# Patient Record
Sex: Female | Born: 1965
Health system: Southern US, Community
[De-identification: ages and names within clinical notes are randomized; demographics above are authoritative.]

## PROBLEM LIST (undated history)

## (undated) DIAGNOSIS — D219 Benign neoplasm of connective and other soft tissue, unspecified: Secondary | ICD-10-CM

## (undated) DIAGNOSIS — I10 Essential (primary) hypertension: Secondary | ICD-10-CM

## (undated) HISTORY — DX: Benign neoplasm of connective and other soft tissue, unspecified: D21.9

## (undated) HISTORY — DX: Essential (primary) hypertension: I10

## (undated) HISTORY — PX: OTHER SURGICAL HISTORY: SHX169

---

## 2020-02-10 DIAGNOSIS — M7581 Other shoulder lesions, right shoulder: Secondary | ICD-10-CM | POA: Diagnosis not present

## 2020-02-10 DIAGNOSIS — M19011 Primary osteoarthritis, right shoulder: Secondary | ICD-10-CM | POA: Diagnosis not present

## 2020-02-10 DIAGNOSIS — M25511 Pain in right shoulder: Secondary | ICD-10-CM | POA: Diagnosis not present

## 2020-02-14 MED FILL — DICLOFENAC SODIUM 1 % GEL: 1 | 30 days supply | Qty: 200 | Fill #0

## 2020-03-01 DIAGNOSIS — N920 Excessive and frequent menstruation with regular cycle: Secondary | ICD-10-CM | POA: Diagnosis not present

## 2020-03-01 DIAGNOSIS — Z124 Encounter for screening for malignant neoplasm of cervix: Secondary | ICD-10-CM | POA: Diagnosis not present

## 2020-03-01 DIAGNOSIS — R7989 Other specified abnormal findings of blood chemistry: Secondary | ICD-10-CM | POA: Diagnosis not present

## 2020-03-06 DIAGNOSIS — D259 Leiomyoma of uterus, unspecified: Secondary | ICD-10-CM | POA: Diagnosis not present

## 2020-03-06 DIAGNOSIS — N83202 Unspecified ovarian cyst, left side: Secondary | ICD-10-CM | POA: Diagnosis not present

## 2020-03-06 DIAGNOSIS — N852 Hypertrophy of uterus: Secondary | ICD-10-CM | POA: Diagnosis not present

## 2020-03-28 MED FILL — AMLODIPINE BESYLATE 5 MG TA: 5 | 90 days supply | Qty: 90 | Fill #0

## 2020-03-28 MED FILL — VALSARTAN 40 MG TABS: 40 | 90 days supply | Qty: 90 | Fill #0

## 2020-05-04 ENCOUNTER — Ambulatory Visit: Payer: Self-pay

## 2020-05-04 ENCOUNTER — Encounter: Payer: Self-pay | Admitting: Family Medicine

## 2020-05-04 ENCOUNTER — Ambulatory Visit (INDEPENDENT_AMBULATORY_CARE_PROVIDER_SITE_OTHER): Payer: 59 | Admitting: Family Medicine

## 2020-05-04 ENCOUNTER — Other Ambulatory Visit: Payer: Self-pay

## 2020-05-04 VITALS — BP 170/103 | HR 88 | Ht 66.0 in | Wt 297.0 lb

## 2020-05-04 DIAGNOSIS — M1711 Unilateral primary osteoarthritis, right knee: Secondary | ICD-10-CM | POA: Diagnosis not present

## 2020-05-04 DIAGNOSIS — G8929 Other chronic pain: Secondary | ICD-10-CM

## 2020-05-04 DIAGNOSIS — M1710 Unilateral primary osteoarthritis, unspecified knee: Secondary | ICD-10-CM | POA: Insufficient documentation

## 2020-05-04 DIAGNOSIS — M179 Osteoarthritis of knee, unspecified: Secondary | ICD-10-CM | POA: Insufficient documentation

## 2020-05-04 MED ORDER — PENNSAID 2 % EX SOLN: 1.0000 "application " | Freq: Two times a day (BID) | CUTANEOUS | 3 refills | Status: AC

## 2020-05-04 MED ORDER — IBUPROFEN-FAMOTIDINE 800-26.6 MG PO TABS: 1.0000 | ORAL_TABLET | Freq: Three times a day (TID) | ORAL | 3 refills | Status: AC

## 2020-05-04 NOTE — Assessment & Plan Note (Signed)
Seems to have a component of patellofemoral syndrome as well as degenerative changes.  Tracking seems to be more of an issue currently.  Does have moderate changes in the medial aspect -Counseled on home exercise therapy and supportive care. -Duexis sample.  Prescription sent in. -Pennsaid sent in. -Reaction brace. -Could consider imaging, physical therapy or injections improvement.

## 2020-05-04 NOTE — Progress Notes (Signed)
Linda Horton - 54 y.o. female MRN [id number]  Date of birth: 09-29-1966  SUBJECTIVE:  Including CC & ROS.  Chief Complaint  Patient presents with  . Knee Pain    bilateral knee    Linda Horton is a 54 y.o. female that is presenting with right knee pain.  The pain is acute on chronic in nature.  The pain is occurring anteriorly as well as medially.  It is worse with walking and standing.  Has pain intermittently.  Has tried ibuprofen which seems to help.  No history of surgery.  Seems localized to the knee.  No mechanical symptoms.   Review of Systems See HPI   HISTORY: Past Medical, Surgical, Social, and Family History Reviewed & Updated per EMR.   Pertinent Historical Findings include:  History reviewed. No pertinent past medical history.  History reviewed. No pertinent surgical history.  History reviewed. No pertinent family history.  Social History   Socioeconomic History  . Marital status: Unknown    Spouse name: Not on file  . Number of children: Not on file  . Years of education: Not on file  . Highest education level: Not on file  Occupational History  . Not on file  Tobacco Use  . Smoking status: Never Smoker  . Smokeless tobacco: Never Used  Substance and Sexual Activity  . Alcohol use: Not on file  . Drug use: Not on file  . Sexual activity: Not on file  Other Topics Concern  . Not on file  Social History Narrative  . Not on file   Social Determinants of Health   Financial Resource Strain:   . Difficulty of Paying Living Expenses:   Food Insecurity:   . Worried About Charity fundraiser in the Last Year:   . Arboriculturist in the Last Year:   Transportation Needs:   . Film/video editor (Medical):   Marland Kitchen Lack of Transportation (Non-Medical):   Physical Activity:   . Days of Exercise per Week:   . Minutes of Exercise per Session:   Stress:   . Feeling of Stress :   Social Connections:   . Frequency of Communication with Friends and Family:    . Frequency of Social Gatherings with Friends and Family:   . Attends Religious Services:   . Active Member of Clubs or Organizations:   . Attends Archivist Meetings:   Marland Kitchen Marital Status:   Intimate Partner Violence:   . Fear of Current or Ex-Partner:   . Emotionally Abused:   Marland Kitchen Physically Abused:   . Sexually Abused:      PHYSICAL EXAM:  VS: BP (!) 170/103   Pulse 88   Ht 5\' 6"  (1.676 m)   Wt 297 lb (134.7 kg)   BMI 47.94 kg/m  Physical Exam Gen: NAD, alert, cooperative with exam, well-appearing MSK:  Right knee:  No effusion  Normal strength resistance. No tenderness to palpation over the medial or lateral joint line. Normal range of motion. No instability. Negative McMurray's test. No pain with patellar grind. Neurovascularly intact  Limited ultrasound: Right knee:  No effusion suprapatellar pouch. Normal-appearing quadricep and patellar tendon. Moderate medial joint space narrowing with outpouching of the meniscus. Moderate lateral joint space narrowing  Summary: Degenerative changes of the medial compartment and meniscus.  Ultrasound and interpretation by Clearance Coots, MD    ASSESSMENT & PLAN:   Primary osteoarthritis of right knee Seems to have a component of patellofemoral syndrome as well as  degenerative changes.  Tracking seems to be more of an issue currently.  Does have moderate changes in the medial aspect -Counseled on home exercise therapy and supportive care. -Duexis sample.  Prescription sent in. -Pennsaid sent in. -Reaction brace. -Could consider imaging, physical therapy or injections improvement.

## 2020-05-04 NOTE — Patient Instructions (Signed)
Nice to meet you Please try ice  Please try the exercises  Please take the duexis for three days straight and then as needed  Please don't use the pennsaid and the duexis at the same time.  Please try a recumbent bike or getting in the water for exercise  Please send me a message in [place] with any questions or updates.  Please see me back in 4 weeks.   --Dr. Raeford Razor

## 2020-05-04 NOTE — Progress Notes (Signed)
Medication Samples have been provided to the patient.  Drug name: Duexis       Strength: 800mg /26.6mg         Qty: 1 Box  [id number]  Exp.Date: 01/2021  Dosing instructions: Take 1 tablet by mouth three (3) times a day.  The patient has been instructed regarding the correct time, dose, and frequency of taking this medication, including desired effects and most common side effects.   Sherrie George, MA 9:12 AM 05/04/2020

## 2020-06-01 ENCOUNTER — Ambulatory Visit: Payer: 59 | Admitting: Family Medicine

## 2020-06-08 ENCOUNTER — Ambulatory Visit: Payer: 59 | Admitting: Family Medicine

## 2020-06-28 MED FILL — AMLODIPINE BESYLATE 5 MG TA: 5 | 90 days supply | Qty: 90 | Fill #1

## 2020-06-28 MED FILL — VALSARTAN 40 MG TABLET: 40 | 90 days supply | Qty: 90 | Fill #1

## 2020-07-19 ENCOUNTER — Ambulatory Visit: Payer: 59 | Admitting: Family Medicine

## 2020-08-02 DIAGNOSIS — Z23 Encounter for immunization: Secondary | ICD-10-CM | POA: Diagnosis not present

## 2020-09-09 ENCOUNTER — Telehealth: Payer: 59 | Admitting: Nurse Practitioner

## 2020-09-09 DIAGNOSIS — M545 Low back pain, unspecified: Secondary | ICD-10-CM

## 2020-09-09 DIAGNOSIS — R399 Unspecified symptoms and signs involving the genitourinary system: Secondary | ICD-10-CM

## 2020-09-09 NOTE — Progress Notes (Signed)
Based on what you shared with me it looks like you have urinary symptoms with back pain,that should be evaluated in a face to face office visit. Due to associated back pain, you need to have a urinalysis and urine culture for proper treatment.    NOTE: If you entered your credit card information for this eVisit, you will not be charged. You may see a "hold" on your card for the $35 but that hold will drop off and you will not have a charge processed.  If you are having a true medical emergency please call 911.     For an urgent face to face visit, [place] has four urgent care centers for your convenience:   . [facility]    [phone]                  Get Driving Directions  [address], [place] [address] . 10 am to 8 pm Monday-Friday . 12 pm to 8 pm Saturday-Sunday   . [facility] at [place]                  Get Driving Directions  [address], [place], [place] [address] . 8 am to 8 pm Monday-Friday . 9 am to 6 pm Saturday . 11 am to 6 pm Sunday   . [facility] at [place]                  Get Driving Directions   [address], [place] [address] . 8 am to 8 pm Monday-Friday . 8 am to 4 pm Saturday-Sunday    . [facility] at [place]                    Get Driving Directions  [phone]  [address] [place], [place] [address]  . Monday-Friday, 12 PM to 6 PM    Your e-visit answers were reviewed by a board certified advanced clinical practitioner to complete your personal care plan.  Thank you for using e-Visits.

## 2020-09-15 ENCOUNTER — Encounter: Payer: Self-pay | Admitting: Obstetrics & Gynecology

## 2020-09-15 ENCOUNTER — Other Ambulatory Visit: Payer: Self-pay

## 2020-09-15 ENCOUNTER — Other Ambulatory Visit (HOSPITAL_BASED_OUTPATIENT_CLINIC_OR_DEPARTMENT_OTHER): Payer: Self-pay | Admitting: Internal Medicine

## 2020-09-15 ENCOUNTER — Ambulatory Visit (INDEPENDENT_AMBULATORY_CARE_PROVIDER_SITE_OTHER): Payer: 59 | Admitting: Obstetrics & Gynecology

## 2020-09-15 ENCOUNTER — Other Ambulatory Visit (HOSPITAL_COMMUNITY)
Admission: RE | Admit: 2020-09-15 | Discharge: 2020-09-15 | Disposition: A | Discharge status: Home / Self Care | Payer: 59 | Source: Ambulatory Visit | Attending: Obstetrics & Gynecology | Admitting: Obstetrics & Gynecology

## 2020-09-15 VITALS — BP 131/93 | HR 96 | Ht 66.0 in | Wt 297.0 lb

## 2020-09-15 DIAGNOSIS — D219 Benign neoplasm of connective and other soft tissue, unspecified: Secondary | ICD-10-CM | POA: Diagnosis not present

## 2020-09-15 DIAGNOSIS — N939 Abnormal uterine and vaginal bleeding, unspecified: Secondary | ICD-10-CM | POA: Diagnosis not present

## 2020-09-15 DIAGNOSIS — D509 Iron deficiency anemia, unspecified: Secondary | ICD-10-CM | POA: Diagnosis not present

## 2020-09-15 DIAGNOSIS — N840 Polyp of corpus uteri: Secondary | ICD-10-CM | POA: Diagnosis not present

## 2020-09-15 MED ORDER — MEGESTROL ACETATE 40 MG PO TABS: 40.0000 mg | ORAL_TABLET | Freq: Two times a day (BID) | ORAL | 5 refills | Status: DC

## 2020-09-15 MED ORDER — MEGESTROL ACETATE 40 MG PO TABS: 40.0000 mg | ORAL_TABLET | Freq: Two times a day (BID) | ORAL | 2 refills | Status: DC

## 2020-09-15 MED FILL — FLUARIX QUADRIVALENT 0.5 ML: 0.5 | 1 days supply | Qty: 1 | Fill #0

## 2020-09-15 MED FILL — MEGESTROL 40 MG TABLET: 40 | 15 days supply | Qty: 60 | Fill #0

## 2020-09-15 NOTE — Progress Notes (Signed)
Patient wants evaluation of fibroids/ heavy bleeding. Last ultrasound in April with [facility] Kathrene Alu RN

## 2020-09-15 NOTE — Progress Notes (Signed)
Subjective:     Linda Horton is a 54 y.o. female here for a routine exam. G4P4 Current complaints: very heavy abnormal uterine bleeding with clots. Pt bleeds 14 days of the months. Her sx are worse but, this is not a new problem. She has a D&C in 2015 for AUB. She denies being on meds for AUB in the past. She has also had 2 c/s but, no other GYN procedures. She reports that the heavy bleeding began in 2009.  Pt has not had a recent biopsy outside of the D&C in 2015.      Gynecologic History No LMP recorded. Contraception: none Last Pap: 03/01/2020. Results were: normal Last mammogram: 2021 at [facility]. WNL  Obstetric History OB History  Gravida Para Term Preterm AB Living  4 4 4  0 0 4  SAB TAB Ectopic Multiple Live Births  0 0 0 0 4    # Outcome Date GA Lbr Len/2nd Weight Sex Delivery Anes PTL Lv  4 Term           3 Term           2 Term           1 Term            The following portions of the patient's history were reviewed and updated as appropriate: allergies, current medications, past family history, past medical history, past social history, past surgical history and problem list.  Review of Systems Pertinent items are noted in HPI.    Objective:  BP (!) 131/93   Pulse 96   Ht 5\' 6"  (1.676 m)   Wt 297 lb (134.7 kg)   BMI 47.94 kg/m   CONSTITUTIONAL: Well-developed, well-nourished female in no acute distress.  HENT:  Normocephalic, atraumatic EYES: Conjunctivae and EOM are normal. No scleral icterus.  NECK: Normal range of motion SKIN: Skin is warm and dry. No rash noted. Not diaphoretic.No pallor. [place]: Alert and oriented to person, place, and time. Normal coordination.  GU: EGBUS: no lesions Vagina: no blood in vault Cervix: no lesion; no mucopurulent d/c Uterus: small, mobile Adnexa: no masses; sl tender    The indications for endometrial biopsy were reviewed.   Risks of the biopsy including cramping, bleeding, infection, uterine perforation, inadequate  specimen and need for additional procedures  were discussed. The patient states she understands and agrees to undergo procedure today. Consent was signed. Time out was performed. Urine HCG was negative. A sterile speculum was placed in the patient's vagina and the cervix was prepped with Betadine. A single-toothed tenaculum was placed on the anterior lip of the cervix to stabilize it. The 3 mm pipelle was introduced into the endometrial cavity without difficulty to a depth of >13cm, and a moderate amount of tissue was obtained and sent to pathology. The instruments were removed from the patient's vagina. Minimal bleeding from the cervix was noted. The patient tolerated the procedure well. Routine post-procedure instructions were given to the patient. The patient will follow up to review the results and for further management.      Healthy female exam.    03/06/2020 IMPRESSION:  1. Enlarged heterogeneous uterus containing multiple fibroids. Heterogeneity might suggest adenomyosis.  2. Endometrial stripe not well seen but measures approximately 4 mm.  3. 3.1 cm cystic left ovarian lesion.  4. Right ovary not clearly identified.  5. No ascites.   Electronically Signed by: Ruta Hinds Narrative Performed by [id number] INDICATION: Menorrhagia   COMPARISON: 02/21/2016   TECHNIQUE:  Transabdominal and transvaginal pelvic ultrasound.   FINDINGS: Uterus is enlarged and heterogeneous in echotexture measuring 15.4 x 7.6 x 10.3 with multiple fibroids. The endometrial stripe is not well seen but measures approximately 4 mm. Largest fibroid is left fundal measuring 4.6 x 3.7 x 4.4 cm. Left ovary measures 3.9 x 4 x 4.5 cm and is remarkable for 3.1 cm cystic lesion. Right ovary is not clearly identified. No ascites. Procedure Note  Marjory Lies, MD - 03/06/2020  Formatting of this note might be different from the original.  INDICATION: Menorrhagia     Assessment:   AUB thought due to fibroids but, need  to r/o other endometrial process.    Plan:   Diagnoses and all orders for this visit:  Fibroids -     Discontinue: megestrol (MEGACE) 40 MG tablet; Take 1 tablet (40 mg total) by mouth 2 (two) times daily. Can increase to two tablets twice a day in the event of heavy bleeding -     Surgical pathology -     TSH -     CBC -     megestrol (MEGACE) 40 MG tablet; Take 1 tablet (40 mg total) by mouth 2 (two) times daily. Can increase to two tablets twice a day in the event of heavy bleeding  Abnormal uterine bleeding (AUB) -     Discontinue: megestrol (MEGACE) 40 MG tablet; Take 1 tablet (40 mg total) by mouth 2 (two) times daily. Can increase to two tablets twice a day in the event of heavy bleeding -     Surgical pathology -     megestrol (MEGACE) 40 MG tablet; Take 1 tablet (40 mg total) by mouth 2 (two) times daily. Can increase to two tablets twice a day in the event of heavy bleeding  Iron deficiency anemia, unspecified iron deficiency anemia type -     CBC  f/u for results of endo bx F/u in 3 months for AUB  Linda Horton, M.D., Cherlynn June

## 2020-09-16 LAB — CBC
Hematocrit: 38.6 % (ref 34.0–46.6)
Hemoglobin: 12 g/dL (ref 11.1–15.9)
MCH: 23.8 pg — ABNORMAL LOW (ref 26.6–33.0)
MCHC: 31.1 g/dL — ABNORMAL LOW (ref 31.5–35.7)
MCV: 76 fL — ABNORMAL LOW (ref 79–97)
Platelets: 407 10*3/uL (ref 150–450)
RBC: 5.05 x10E6/uL (ref 3.77–5.28)
RDW: 17.2 % — ABNORMAL HIGH (ref 11.7–15.4)
WBC: 9.9 10*3/uL (ref 3.4–10.8)

## 2020-09-16 LAB — TSH: TSH: 3.03 u[IU]/mL (ref 0.450–4.500)

## 2020-09-18 LAB — SURGICAL PATHOLOGY

## 2020-09-29 ENCOUNTER — Other Ambulatory Visit (HOSPITAL_BASED_OUTPATIENT_CLINIC_OR_DEPARTMENT_OTHER): Payer: Self-pay | Admitting: Family Medicine

## 2020-09-29 MED FILL — AMLODIPINE BESYLATE 5 MG TA: 5 | 90 days supply | Qty: 90 | Fill #0

## 2020-09-29 MED FILL — VALSARTAN 40 MG TABS: 40 | 90 days supply | Qty: 90 | Fill #0

## 2020-10-06 DIAGNOSIS — R5383 Other fatigue: Secondary | ICD-10-CM | POA: Diagnosis not present

## 2020-10-06 DIAGNOSIS — Z0000 Encounter for general adult medical examination without abnormal findings: Secondary | ICD-10-CM | POA: Diagnosis not present

## 2020-10-06 DIAGNOSIS — I10 Essential (primary) hypertension: Secondary | ICD-10-CM | POA: Diagnosis not present

## 2020-10-11 DIAGNOSIS — N3000 Acute cystitis without hematuria: Secondary | ICD-10-CM | POA: Diagnosis not present

## 2020-10-11 DIAGNOSIS — R399 Unspecified symptoms and signs involving the genitourinary system: Secondary | ICD-10-CM | POA: Diagnosis not present

## 2020-10-12 ENCOUNTER — Other Ambulatory Visit (HOSPITAL_BASED_OUTPATIENT_CLINIC_OR_DEPARTMENT_OTHER): Payer: Self-pay | Admitting: Internal Medicine

## 2020-10-12 DIAGNOSIS — K219 Gastro-esophageal reflux disease without esophagitis: Secondary | ICD-10-CM | POA: Diagnosis not present

## 2020-10-12 DIAGNOSIS — Z1231 Encounter for screening mammogram for malignant neoplasm of breast: Secondary | ICD-10-CM | POA: Diagnosis not present

## 2020-10-12 DIAGNOSIS — Z1211 Encounter for screening for malignant neoplasm of colon: Secondary | ICD-10-CM | POA: Diagnosis not present

## 2020-10-12 DIAGNOSIS — Z23 Encounter for immunization: Secondary | ICD-10-CM | POA: Diagnosis not present

## 2020-10-12 DIAGNOSIS — I10 Essential (primary) hypertension: Secondary | ICD-10-CM | POA: Diagnosis not present

## 2020-10-12 DIAGNOSIS — E1165 Type 2 diabetes mellitus with hyperglycemia: Secondary | ICD-10-CM | POA: Diagnosis not present

## 2020-10-12 DIAGNOSIS — Z0000 Encounter for general adult medical examination without abnormal findings: Secondary | ICD-10-CM | POA: Diagnosis not present

## 2020-10-12 DIAGNOSIS — E785 Hyperlipidemia, unspecified: Secondary | ICD-10-CM | POA: Diagnosis not present

## 2020-10-16 DIAGNOSIS — Z020 Encounter for examination for admission to educational institution: Secondary | ICD-10-CM | POA: Diagnosis not present

## 2020-10-30 ENCOUNTER — Ambulatory Visit: Payer: 59 | Admitting: Family Medicine

## 2020-11-03 ENCOUNTER — Ambulatory Visit: Payer: Self-pay

## 2020-11-03 ENCOUNTER — Ambulatory Visit: Payer: 59 | Admitting: Family Medicine

## 2020-11-03 ENCOUNTER — Other Ambulatory Visit: Payer: Self-pay

## 2020-11-03 DIAGNOSIS — M170 Bilateral primary osteoarthritis of knee: Secondary | ICD-10-CM | POA: Diagnosis not present

## 2020-11-03 MED ORDER — TRIAMCINOLONE ACETONIDE 40 MG/ML IJ SUSP
40.0000 mg | Freq: Once | INTRAMUSCULAR | Status: AC
  Administered 2020-11-03: 40 mg via INTRA_ARTICULAR

## 2020-11-03 NOTE — Patient Instructions (Addendum)
Good to see you Please try ice  I will call you with the results from today  Please send me a message in MyChart with any questions or updates.  Please see me back in 4 weeks.   --Dr. Raeford Razor

## 2020-11-03 NOTE — Assessment & Plan Note (Addendum)
Acute worsening of bilateral knee pain.  Does have degenerative changes. -Counseled on home exercise therapy and supportive care. -Bilateral injections. -X-ray. -Could consider gel injections or further imaging or physical therapy.

## 2020-11-03 NOTE — Progress Notes (Signed)
[name] - 54 y.o. female MRN [id number]  Date of birth: [birth year]  SUBJECTIVE:  Including CC & ROS.  No chief complaint on file.   Linda Horton is a 54 y.o. female that is presenting with acute worsening bilateral knee pain.  She is trying conservative measures and has had no improvement.  Pain is worse of the medial aspect of each knee.  It is worse with changes in weather..   Review of Systems See HPI   HISTORY: Past Medical, Surgical, Social, and Family History Reviewed & Updated per EMR.   Pertinent Historical Findings include:  Past Medical History:  Diagnosis Date  . Fibroids   . Hypertension     Past Surgical History:  Procedure Laterality Date  . CESAREAN SECTION    . galbladder      Family History  Problem Relation Age of Onset  . Diabetes Father   . Cancer Neg Hx     Social History   Socioeconomic History  . Marital status: Divorced    Spouse name: Not on file  . Number of children: 4  . Years of education: Not on file  . Highest education level: Bachelor's degree (e.g., BA, AB, BS)  Occupational History  . Not on file  Tobacco Use  . Smoking status: Never Smoker  . Smokeless tobacco: Never Used  Vaping Use  . Vaping Use: Never used  Substance and Sexual Activity  . Alcohol use: Never  . Drug use: Never  . Sexual activity: Not Currently  Other Topics Concern  . Not on file  Social History Narrative  . Not on file   Social Determinants of Health   Financial Resource Strain:   . Difficulty of Paying Living Expenses: Not on file  Food Insecurity:   . Worried About Charity fundraiser in the Last Year: Not on file  . Ran Out of Food in the Last Year: Not on file  Transportation Needs:   . Lack of Transportation (Medical): Not on file  . Lack of Transportation (Non-Medical): Not on file  Physical Activity:   . Days of Exercise per Week: Not on file  . Minutes of Exercise per Session: Not on file  Stress:   . Feeling of Stress : Not  on file  Social Connections:   . Frequency of Communication with Friends and Family: Not on file  . Frequency of Social Gatherings with Friends and Family: Not on file  . Attends Religious Services: Not on file  . Active Member of Clubs or Organizations: Not on file  . Attends Archivist Meetings: Not on file  . Marital Status: Not on file  Intimate Partner Violence:   . Fear of Current or Ex-Partner: Not on file  . Emotionally Abused: Not on file  . Physically Abused: Not on file  . Sexually Abused: Not on file     PHYSICAL EXAM:  VS: BP (!) 200/100   Ht 5\' 6"  (1.676 m)   Wt 300 lb (136.1 kg)   BMI 48.42 kg/m  Physical Exam Gen: NAD, alert, cooperative with exam, well-appearing MSK:  Right and left knee:  No effusion  Normal range of motion  Normal strength to resistance  Neurovascularly intact    Aspiration/Injection Procedure Note Linda Horton 05-06-1966  Procedure: Injection Indications: Left knee pain  Procedure Details Consent: Risks of procedure as well as the alternatives and risks of each were explained to the (patient/caregiver).  Consent for procedure obtained. Time Out: Verified patient  identification, verified procedure, site/side was marked, verified correct patient position, special equipment/implants available, medications/allergies/relevent history reviewed, required imaging and test results available.  Performed.  The area was cleaned with iodine and alcohol swabs.    The left knee superior lateral suprapatellar pouch was injected 3 cc of 1% lidocaine on a 21-gauge 2 inch needle.  The syringe was switched and a mixture containing 1 cc's of 40 mg Kenalog and 4 cc's of 0.25% bupivacaine was injected.  Ultrasound was used. Images were obtained in long views showing the injection.     A sterile dressing was applied.  Patient did tolerate procedure well.   Aspiration/Injection Procedure Note Linda Horton 12-30-1965  Procedure:  Injection Indications: Right knee pain  Procedure Details Consent: Risks of procedure as well as the alternatives and risks of each were explained to the (patient/caregiver).  Consent for procedure obtained. Time Out: Verified patient identification, verified procedure, site/side was marked, verified correct patient position, special equipment/implants available, medications/allergies/relevent history reviewed, required imaging and test results available.  Performed.  The area was cleaned with iodine and alcohol swabs.    The right knee superior lateral suprapatellar pouch was injected 3 cc of 1% lidocaine on a 21-gauge 2 inch needle.  The syringe was switched and a mixture containing 1 cc's of 40 mg Kenalog and 4 cc's of 0.25% bupivacaine was injected.  Ultrasound was used. Images were obtained in long views showing the injection.     A sterile dressing was applied.  Patient did tolerate procedure well.     ASSESSMENT & PLAN:   OA (osteoarthritis) of knee Acute worsening of bilateral knee pain.  Does have degenerative changes. -Counseled on home exercise therapy and supportive care. -Bilateral injections. -X-ray. -Could consider gel injections or further imaging or physical therapy.

## 2020-12-06 DIAGNOSIS — R059 Cough, unspecified: Secondary | ICD-10-CM | POA: Diagnosis not present

## 2020-12-06 DIAGNOSIS — Z20822 Contact with and (suspected) exposure to covid-19: Secondary | ICD-10-CM | POA: Diagnosis not present

## 2020-12-06 DIAGNOSIS — R5383 Other fatigue: Secondary | ICD-10-CM | POA: Diagnosis not present

## 2020-12-07 DIAGNOSIS — I10 Essential (primary) hypertension: Secondary | ICD-10-CM | POA: Diagnosis not present

## 2020-12-07 DIAGNOSIS — Z79899 Other long term (current) drug therapy: Secondary | ICD-10-CM | POA: Diagnosis not present

## 2020-12-07 DIAGNOSIS — R002 Palpitations: Secondary | ICD-10-CM | POA: Diagnosis not present

## 2020-12-07 DIAGNOSIS — K219 Gastro-esophageal reflux disease without esophagitis: Secondary | ICD-10-CM | POA: Diagnosis not present

## 2020-12-07 DIAGNOSIS — Z888 Allergy status to other drugs, medicaments and biological substances status: Secondary | ICD-10-CM | POA: Diagnosis not present

## 2020-12-07 DIAGNOSIS — R079 Chest pain, unspecified: Secondary | ICD-10-CM | POA: Diagnosis not present

## 2020-12-07 DIAGNOSIS — R059 Cough, unspecified: Secondary | ICD-10-CM | POA: Diagnosis not present

## 2020-12-28 ENCOUNTER — Other Ambulatory Visit (HOSPITAL_BASED_OUTPATIENT_CLINIC_OR_DEPARTMENT_OTHER): Payer: Self-pay | Admitting: Internal Medicine

## 2020-12-29 ENCOUNTER — Ambulatory Visit (HOSPITAL_BASED_OUTPATIENT_CLINIC_OR_DEPARTMENT_OTHER)
Admission: RE | Admit: 2020-12-29 | Discharge: 2020-12-29 | Disposition: A | Discharge status: Home / Self Care | Payer: 59 | Source: Ambulatory Visit | Attending: Family Medicine | Admitting: Family Medicine

## 2020-12-29 ENCOUNTER — Other Ambulatory Visit: Payer: Self-pay

## 2020-12-29 DIAGNOSIS — M1712 Unilateral primary osteoarthritis, left knee: Secondary | ICD-10-CM | POA: Diagnosis not present

## 2020-12-29 DIAGNOSIS — M1711 Unilateral primary osteoarthritis, right knee: Secondary | ICD-10-CM | POA: Diagnosis not present

## 2020-12-29 DIAGNOSIS — M170 Bilateral primary osteoarthritis of knee: Secondary | ICD-10-CM | POA: Insufficient documentation

## 2020-12-29 MED FILL — AMLODIPINE BESYLATE 5 MG TA: 5 | 90 days supply | Qty: 90 | Fill #1

## 2020-12-29 MED FILL — VALSARTAN 40 MG TABS: 40 | 90 days supply | Qty: 90 | Fill #1

## 2020-12-29 MED FILL — OMEPRAZOLE 20 MG CAP: 20 | 30 days supply | Qty: 30 | Fill #0

## 2021-01-02 ENCOUNTER — Telehealth: Payer: Self-pay | Admitting: Family Medicine

## 2021-01-02 DIAGNOSIS — M170 Bilateral primary osteoarthritis of knee: Secondary | ICD-10-CM

## 2021-01-02 NOTE — Telephone Encounter (Signed)
Informed of results. Referral to PT. Can try gel injection.   Rosemarie Ax, MD [facility] 01/02/2021, 8:22 AM

## 2021-01-17 MED FILL — OMEPRAZOLE 20 MG CAP: 20 | 30 days supply | Qty: 30 | Fill #0

## 2021-01-17 MED FILL — VIT D3-50 50,000 UNITS CAPS: 1.25 MG | 28 days supply | Qty: 4 | Fill #0

## 2021-01-29 ENCOUNTER — Telehealth: Payer: Self-pay | Admitting: Family Medicine

## 2021-01-29 NOTE — Telephone Encounter (Signed)
Called pt to advise Handicap placard is ready for pick up--- left message.--glh

## 2021-02-01 ENCOUNTER — Other Ambulatory Visit (HOSPITAL_BASED_OUTPATIENT_CLINIC_OR_DEPARTMENT_OTHER): Payer: Self-pay | Admitting: Internal Medicine

## 2021-02-01 MED FILL — VIT D2 1.25 MG (50,000 UNIT: 1.25 MG | 28 days supply | Qty: 4 | Fill #0

## 2021-02-02 ENCOUNTER — Other Ambulatory Visit: Payer: Self-pay

## 2021-02-02 ENCOUNTER — Ambulatory Visit: Payer: Self-pay

## 2021-02-02 ENCOUNTER — Ambulatory Visit (INDEPENDENT_AMBULATORY_CARE_PROVIDER_SITE_OTHER): Payer: 59 | Admitting: Family Medicine

## 2021-02-02 VITALS — BP 160/100 | Ht 66.0 in | Wt 295.0 lb

## 2021-02-02 DIAGNOSIS — M170 Bilateral primary osteoarthritis of knee: Secondary | ICD-10-CM

## 2021-02-02 MED ORDER — TRIAMCINOLONE ACETONIDE 40 MG/ML IJ SUSP
40.0000 mg | Freq: Once | INTRAMUSCULAR | Status: AC
  Administered 2021-02-02: 40 mg via INTRA_ARTICULAR

## 2021-02-02 NOTE — Progress Notes (Signed)
Linda Horton - 55 y.o. female MRN [id number]  Date of birth: 10/31/1966  SUBJECTIVE:  Including CC & ROS.  No chief complaint on file.   Linda Horton is a 55 y.o. female that is presenting with acute exacerbation of her leg pain.  The right knee is worse than the left knee.   Review of Systems See HPI   HISTORY: Past Medical, Surgical, Social, and Family History Reviewed & Updated per EMR.   Pertinent Historical Findings include:  Past Medical History:  Diagnosis Date  . Fibroids   . Hypertension     Past Surgical History:  Procedure Laterality Date  . CESAREAN SECTION    . galbladder      Family History  Problem Relation Age of Onset  . Diabetes Father   . Cancer Neg Hx     Social History   Socioeconomic History  . Marital status: Divorced    Spouse name: Not on file  . Number of children: 4  . Years of education: Not on file  . Highest education level: Bachelor's degree (e.g., BA, AB, BS)  Occupational History  . Not on file  Tobacco Use  . Smoking status: Never Smoker  . Smokeless tobacco: Never Used  Vaping Use  . Vaping Use: Never used  Substance and Sexual Activity  . Alcohol use: Never  . Drug use: Never  . Sexual activity: Not Currently  Other Topics Concern  . Not on file  Social History Narrative  . Not on file   Social Determinants of Health   Financial Resource Strain: Not on file  Food Insecurity: Not on file  Transportation Needs: Not on file  Physical Activity: Not on file  Stress: Not on file  Social Connections: Not on file  Intimate Partner Violence: Not on file     PHYSICAL EXAM:  VS: BP (!) 160/100   Ht 5\' 6"  (1.676 m)   Wt 295 lb (133.8 kg)   BMI 47.61 kg/m  Physical Exam Gen: NAD, alert, cooperative with exam, well-appearing MSK:  Right and left knee: Normal range of motion. No obvious effusion. Tenderness palpation of the medial joint space. Neurovascularly intact   Aspiration/Injection Procedure  Note Imberly Troxler 11-08-1966  Procedure: Injection Indications: Right knee pain  Procedure Details Consent: Risks of procedure as well as the alternatives and risks of each were explained to the (patient/caregiver).  Consent for procedure obtained. Time Out: Verified patient identification, verified procedure, site/side was marked, verified correct patient position, special equipment/implants available, medications/allergies/relevent history reviewed, required imaging and test results available.  Performed.  The area was cleaned with iodine and alcohol swabs.    The right knee superior lateral suprapatellar pouch was injected using 3 cc of 1% lidocaine on a 21-gauge 2 inch needle.  The syringe was switched to mixture containing 1 cc's of 40 Kenalog and 4 cc's of 0.25% bupivacaine was injected.  Ultrasound was used. Images were obtained in long views showing the injection.     A sterile dressing was applied.  Patient did tolerate procedure well.     ASSESSMENT & PLAN:   OA (osteoarthritis) of knee Acute on chronic in nature.  Right knee seems to be more exacerbated than the left.  She does have significant degenerative changes of the medial compartments. -Counseled on home exercise therapy and supportive care - injection  - pursue gel injection   Morbid obesity ([place]) Has a BMI of 47.  Would need a BMI less than 40 in order to  have knee arthroplasty performed. -Referral to medical weight management center.

## 2021-02-02 NOTE — Assessment & Plan Note (Addendum)
Acute on chronic in nature.  Right knee seems to be more exacerbated than the left.  She does have significant degenerative changes of the medial compartments. -Counseled on home exercise therapy and supportive care - injection  - pursue gel injection

## 2021-02-02 NOTE — Patient Instructions (Signed)
Good to see you Please try ice  Please do the range of motion   Please send me a message in MyChart with any questions or updates.  We will call when the gel injections are in.   --Dr. Raeford Razor

## 2021-02-02 NOTE — Assessment & Plan Note (Signed)
Has a BMI of 47.  Would need a BMI less than 40 in order to have knee arthroplasty performed. -Referral to medical weight management center.

## 2021-04-03 ENCOUNTER — Other Ambulatory Visit (HOSPITAL_BASED_OUTPATIENT_CLINIC_OR_DEPARTMENT_OTHER): Payer: Self-pay

## 2021-05-11 ENCOUNTER — Telehealth: Payer: Self-pay | Admitting: Family Medicine

## 2021-05-11 NOTE — Telephone Encounter (Signed)
Patient called to see if provider could write a note to her Employer [employer]) for a Sit to Stand desk top due to her bilateral knee pain .  --Forwarding request to provider , she will access letter via Mychart portal.  --glh

## 2021-05-15 NOTE — Telephone Encounter (Signed)
Left VM for patient. If she calls back please have her speak with a nurse/CMA and inform that we can write a prescription for a varidesk. She can stop by to pick up the Rx.   If any questions then please take the best time and phone number to call and I will try to call her back.   Rosemarie Ax, MD [facility] 05/15/2021, 1:41 PM

## 2021-06-29 ENCOUNTER — Telehealth: Payer: Self-pay | Admitting: Family Medicine

## 2021-06-29 NOTE — Telephone Encounter (Signed)
Patient called to request a permanent Handicap plaquard, says last one was only for 6 month and is about to expire.  --Forwarding request to provider for approval  --glh

## 2021-07-11 ENCOUNTER — Telehealth: Payer: 59 | Admitting: Obstetrics & Gynecology

## 2021-07-20 ENCOUNTER — Telehealth: Payer: Self-pay | Admitting: Family Medicine

## 2021-07-20 NOTE — Telephone Encounter (Signed)
Pt called states spk w/ Dr. Raeford Razor about possibly getting the gel injection( wanted to know what type it is ( Steroid ? Or something else).  Pt ask for some one to call her back to discuss, wants to make sure it's something her Health Ins will cover.  --Forwarding request to med asst to reach out to patient.  --glh

## 2021-07-23 NOTE — Telephone Encounter (Signed)
Left VM for patient. If she calls back please have her speak with a nurse/CMA and inform would could try a gel injection or zilretta to help with the knee pain.   If any questions then please take the best time and phone number to call and I will try to call her back.   Rosemarie Ax, MD [facility] 07/23/2021, 1:04 PM

## 2022-04-08 ENCOUNTER — Ambulatory Visit (INDEPENDENT_AMBULATORY_CARE_PROVIDER_SITE_OTHER): Payer: Federal, State, Local not specified - PPO | Admitting: Obstetrics & Gynecology

## 2022-04-08 ENCOUNTER — Other Ambulatory Visit (HOSPITAL_COMMUNITY)
Admission: RE | Admit: 2022-04-08 | Discharge: 2022-04-08 | Disposition: A | Discharge status: Home / Self Care | Payer: Federal, State, Local not specified - PPO | Source: Ambulatory Visit | Attending: Obstetrics & Gynecology | Admitting: Obstetrics & Gynecology

## 2022-04-08 ENCOUNTER — Encounter: Payer: Self-pay | Admitting: Obstetrics & Gynecology

## 2022-04-08 ENCOUNTER — Ambulatory Visit (INDEPENDENT_AMBULATORY_CARE_PROVIDER_SITE_OTHER): Payer: Federal, State, Local not specified - PPO

## 2022-04-08 VITALS — BP 175/94 | HR 97 | Wt 305.0 lb

## 2022-04-08 DIAGNOSIS — N939 Abnormal uterine and vaginal bleeding, unspecified: Secondary | ICD-10-CM

## 2022-04-08 DIAGNOSIS — R6889 Other general symptoms and signs: Secondary | ICD-10-CM

## 2022-04-08 DIAGNOSIS — I10 Essential (primary) hypertension: Secondary | ICD-10-CM

## 2022-04-08 DIAGNOSIS — N924 Excessive bleeding in the premenopausal period: Secondary | ICD-10-CM

## 2022-04-08 MED ORDER — LEVONORGESTREL 20 MCG/DAY IU IUD
1.0000 | INTRAUTERINE_SYSTEM | Freq: Once | INTRAUTERINE | Status: AC
  Administered 2022-04-08: 1 via INTRAUTERINE

## 2022-04-08 NOTE — Progress Notes (Signed)
History:  ?56 y.o. [id number] here today for PMPB. She was seen prev for PMPB. It returned and she is here for eval. The bleeding was not assoc with pain or other constitutional sx.  Pt had a D&C in 2015 for AUB. She denies being on meds for AUB in the past. She has also had 2 c/s but, no other GYN procedures. She reports that the heavy bleeding began in 2009.  Pt has not had a recent biopsy outside of the D&C in 2015.      ? ?The following portions of the patient's history were reviewed and updated as appropriate: allergies, current medications, past family history, past medical history, past social history, past surgical history and problem list. ? ?Review of Systems:  ?Pertinent items are noted in HPI. ?   ?Objective:  ?Physical Exam ?Blood pressure (!) 175/94, pulse 97, weight (!) 305 lb (138.3 kg), last menstrual period 03/16/2022. ? ?CONSTITUTIONAL: Well-developed, well-nourished female in no acute distress.  ?HENT:  Normocephalic, atraumatic ?EYES: Conjunctivae and EOM are normal. No scleral icterus.  ?NECK: Normal range of motion ?SKIN: Skin is warm and dry. No rash noted. Not diaphoretic.No pallor. ?[place]: Alert and oriented to person, place, and time. Normal coordination.  ?Abd: Soft, nontender and nondistended ? ?GYNECOLOGY CLINIC PROCEDURE NOTE ? ?The indications for endometrial biopsy were reviewed.   Risks of the biopsy including cramping, bleeding, infection, uterine perforation, inadequate specimen and need for additional procedures  were discussed. The patient states she understands and agrees to undergo procedure today. Consent was signed. Time out was performed. Urine HCG was negative. ?A sterile speculum was placed in the patient's vagina and the cervix was prepped with Betadine. NOTE: Pelvic: Normal appearing external genitalia; normal appearing vaginal mucosa and cervix.  Normal discharge.  Small uterus, no other palpable masses, no uterine or adnexal tenderness. Her cervix is VERY ant and I  had quite a bit of difficulty accessing it. Once I identified it, I grapsed it with a single toothed tenaculum to stabilize it. The 3 mm pipelle was introduced into the endometrial cavity with difficulty to a depth of 8 cm, and a moderate amount of tissue was obtained and sent to pathology. Given the challenge of the procedure and the discomfort in the exam for the pt,  I asked her if she wanted to proceed with an IUD and she consented. ?I discussed risks of irregular bleeding, cramping, infection, malpositioning or misplacement of the IUD outside the uterus which may require further procedures. Time out was performed.  Urine pregnancy test negative. IUD insertion: Uterus sounded to 8 cm.  Mirena IUD placed per manufacturer's recommendations.  Strings trimmed to 3 cm. Tenaculum was removed, good hemostasis noted.  Patient tolerated procedure ok but, was glad that she proceeded.  ? ? ?Assessment & Plan:  ?Perimenopausal bleeding- s/p endo bx and insertion of LnIUD.   ? ?Routine post-procedure instructions were given to the patient. The patient will follow up in 4 weeks to review the results, for IUD check and for further management.    Patient was given post-procedure instructions.   ? ?Chamya Hunton L. Harraway-Smith, M.D., [place] ? ?

## 2022-04-09 LAB — CBC
Hematocrit: 28.3 % — ABNORMAL LOW (ref 34.0–46.6)
Hemoglobin: 8.6 g/dL — ABNORMAL LOW (ref 11.1–15.9)
MCH: 22.1 pg — ABNORMAL LOW (ref 26.6–33.0)
MCHC: 30.4 g/dL — ABNORMAL LOW (ref 31.5–35.7)
MCV: 73 fL — ABNORMAL LOW (ref 79–97)
NRBC: 1 % — ABNORMAL HIGH (ref 0–0)
Platelets: 517 10*3/uL — ABNORMAL HIGH (ref 150–450)
RBC: 3.9 x10E6/uL (ref 3.77–5.28)
RDW: 16.7 % — ABNORMAL HIGH (ref 11.7–15.4)
WBC: 11.8 10*3/uL — ABNORMAL HIGH (ref 3.4–10.8)

## 2022-04-09 LAB — SURGICAL PATHOLOGY

## 2022-04-10 ENCOUNTER — Encounter: Payer: Self-pay | Admitting: General Practice

## 2022-04-24 ENCOUNTER — Encounter: Payer: Self-pay | Admitting: Obstetrics & Gynecology

## 2022-04-24 ENCOUNTER — Telehealth (INDEPENDENT_AMBULATORY_CARE_PROVIDER_SITE_OTHER): Payer: Federal, State, Local not specified - PPO | Admitting: Obstetrics & Gynecology

## 2022-04-24 DIAGNOSIS — D500 Iron deficiency anemia secondary to blood loss (chronic): Secondary | ICD-10-CM | POA: Diagnosis not present

## 2022-04-24 DIAGNOSIS — N939 Abnormal uterine and vaginal bleeding, unspecified: Secondary | ICD-10-CM

## 2022-04-24 DIAGNOSIS — D219 Benign neoplasm of connective and other soft tissue, unspecified: Secondary | ICD-10-CM

## 2022-04-24 NOTE — Progress Notes (Signed)
? ?TELEHEALTH GYNECOLOGY VISIT ENCOUNTER NOTE ? ?Provider location: [facility] at [facility]  ? ?Patient location: Home ? ?I connected with Linda Horton on 04/24/22 at  3:50 PM EDT by telephone and verified that I am speaking with the correct person using two identifiers. Patient was unable to do MyChart audiovisual encounter due to technical difficulties, she tried several times.  ?  ?I discussed the limitations, risks, security and privacy concerns of performing an evaluation and management service by telephone and the availability of in person appointments. I also discussed with the patient that there may be a patient responsible charge related to this service. The patient expressed understanding and agreed to proceed. ?  ?History:  ?Linda Horton is a 56 y.o. [phone] female being evaluated today for results of [place] and blood tests done for for eval of AUB. She denies any abnormal vaginal discharge, bleeding, pelvic pain or other concerns.  Her last bleeding was in Jan (?). She has had no problems from the IUD. She reports that she is supposed to go to [facility], [place] for 3 months for work. No issues with the IUD.    ?  ?  ?Past Medical History:  ?Diagnosis Date  ? Fibroids   ? Hypertension   ? ?Past Surgical History:  ?Procedure Laterality Date  ? CESAREAN SECTION    ? galbladder    ? ?The following portions of the patient's history were reviewed and updated as appropriate: allergies, current medications, past family history, past medical history, past social history, past surgical history and problem list.  ? ? ? ?Review of Systems:  ?Pertinent items noted in HPI and remainder of comprehensive ROS otherwise negative. ? ?Physical Exam:  ? ?General:  Alert, oriented and cooperative.   ?Mental Status: Normal mood and affect perceived. Normal judgment and thought content.  ?Physical exam deferred due to nature of the encounter ? ?Labs and Imaging ?No results found for this or any  previous visit (from the past 336 hour(s)). ?US PELVIS TRANSVAGINAL NON-OB (TV ONLY) ? ?Result Date: 04/08/2022 ?CLINICAL DATA:  Perimenopausal bleeding EXAM: ULTRASOUND PELVIS TRANSVAGINAL TECHNIQUE: Transvaginal ultrasound examination of the pelvis was performed including evaluation of the uterus, ovaries, adnexal regions, and pelvic cul-de-sac. COMPARISON:  None. FINDINGS: Uterus Measurements: 10.5 x 8.4 x 8.9 cm = volume: 412.2 mL. There is inhomogeneous echogenicity in myometrium. There is possible 3.2 x 2.4 x 2.6 cm fibroid in the right anterior lower uterine segment. Uterus is retroverted. Endometrium Thickness: 9 mm. Evaluation was technically difficult due to inhomogeneous echogenicity in myometrium and position of the uterus. The endometrium is visualized only in the lower uterine segment. Right ovary Not sonographically visualized. Left ovary Not sonographically visualized. Other findings:  No abnormal free fluid IMPRESSION: Retroverted uterus. There is inhomogeneous echogenicity in myometrium with possible 3.2 cm fibroid in the lower uterine segment. Ovaries are not sonographically visualized. Transvaginal pelvic sonogram is otherwise unremarkable. Electronically Signed   By: Elmer Picker M.D.   On: 04/08/2022 15:36      ? ? ?  Latest Ref Rng & Units 04/08/2022  ? 11:36 AM 09/15/2020  ? 10:30 AM  ?CBC  ?WBC 3.4 - 10.8 x10E3/uL 11.8   9.9    ?Hemoglobin 11.1 - 15.9 g/dL 8.6   12.0    ?Hematocrit 34.0 - 46.6 % 28.3   38.6    ?Platelets 150 - 450 x10E3/uL 517   407    ?  ?Assessment and Plan:  ?Diagnoses and  all orders for this visit: ? ?Anemia due to blood loss, chronic ? ?Fibroid ? ?Abnormal uterine bleeding (AUB) ? ? FeSo4 1 po q day ?F/u in 3 months when she arrives back from [facility]. ?Reviewed [place] and labs. No changes at present.   ?   ?  ?I discussed the assessment and treatment plan with the patient. The patient was provided an opportunity to ask questions and all were answered. The patient agreed with  the plan and demonstrated an understanding of the instructions. ?  ?The patient was advised to call back or seek an in-person evaluation/go to the ED if the symptoms worsen or if the condition fails to improve as anticipated. ? ?I provided 15 minutes of non-face-to-face time during this encounter. ? ? ?Lavonia Drafts, MD ?[facility], [place]  ?

## 2022-04-27 ENCOUNTER — Encounter: Payer: Self-pay | Admitting: Obstetrics & Gynecology

## 2022-05-01 ENCOUNTER — Other Ambulatory Visit: Payer: Self-pay | Admitting: Obstetrics & Gynecology

## 2022-05-01 ENCOUNTER — Other Ambulatory Visit (HOSPITAL_BASED_OUTPATIENT_CLINIC_OR_DEPARTMENT_OTHER): Payer: Self-pay

## 2022-05-01 DIAGNOSIS — N939 Abnormal uterine and vaginal bleeding, unspecified: Secondary | ICD-10-CM

## 2022-05-01 MED ORDER — INTEGRA 62.5-62.5-40-3 MG PO CAPS
1.0000 | ORAL_CAPSULE | Freq: Every day | ORAL | 3 refills | Status: AC
  Filled 2022-05-01: qty 30, 30d supply, fill #0

## 2022-05-02 ENCOUNTER — Other Ambulatory Visit (HOSPITAL_BASED_OUTPATIENT_CLINIC_OR_DEPARTMENT_OTHER): Payer: Self-pay

## 2022-05-08 ENCOUNTER — Ambulatory Visit: Payer: Federal, State, Local not specified - PPO | Admitting: Obstetrics & Gynecology

## 2022-05-09 ENCOUNTER — Other Ambulatory Visit (HOSPITAL_BASED_OUTPATIENT_CLINIC_OR_DEPARTMENT_OTHER): Payer: Self-pay

## 2023-01-10 IMAGING — US US TRANSVAGINAL NON-OB
1 series · 14 of 25 positions shown · non-contrast
Comparison: None.

CLINICAL DATA: Perimenopausal bleeding

EXAM:
ULTRASOUND PELVIS TRANSVAGINAL
TECHNIQUE: Transvaginal ultrasound examination of the pelvis was performed
including evaluation of the uterus, ovaries, adnexal regions, and
pelvic cul-de-sac.

[Series 1: gyn · 14 of 33 slices shown]
[im 1/33]
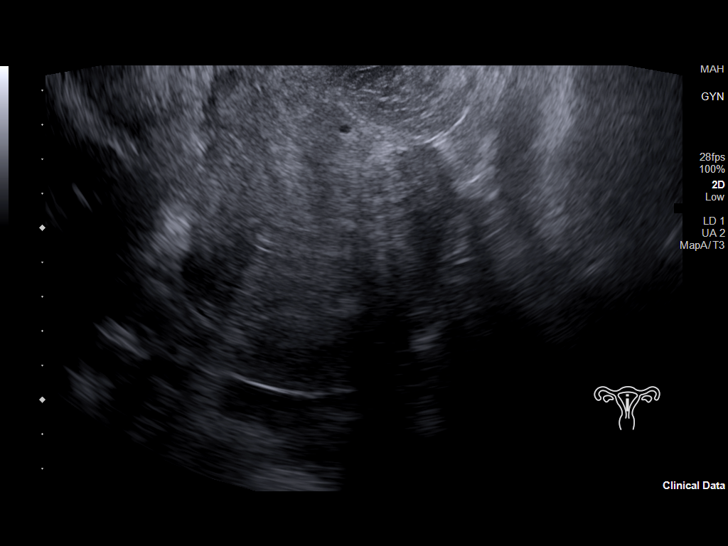
[im 3/33]
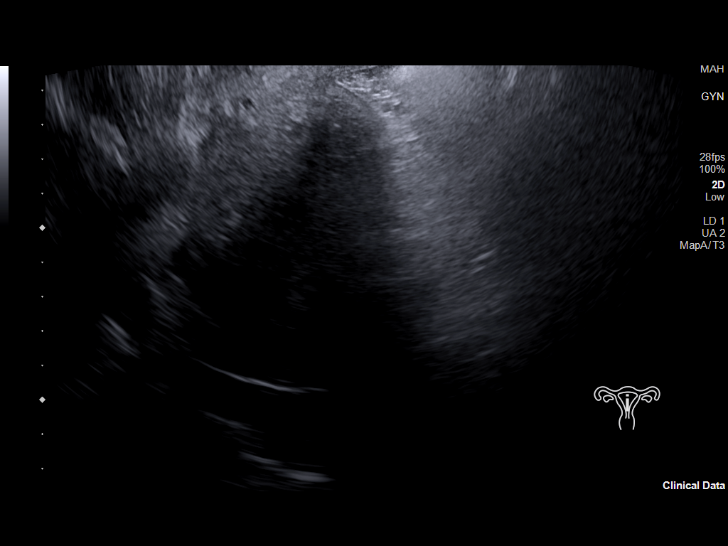
[im 6/33]
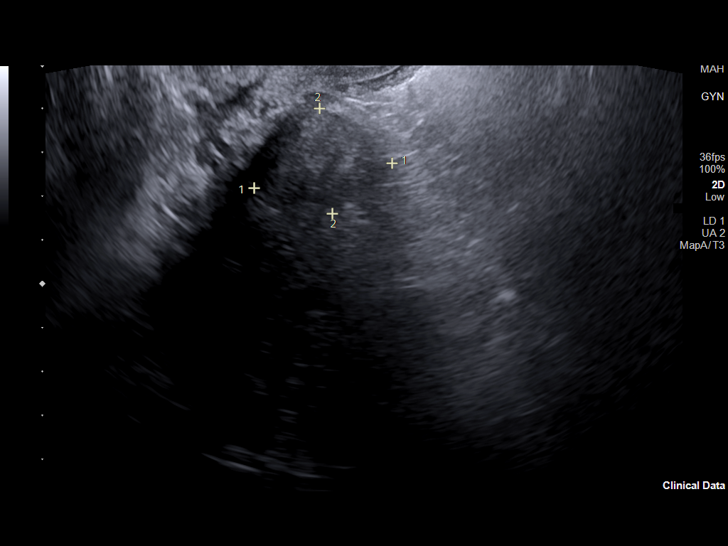
[im 9/33]
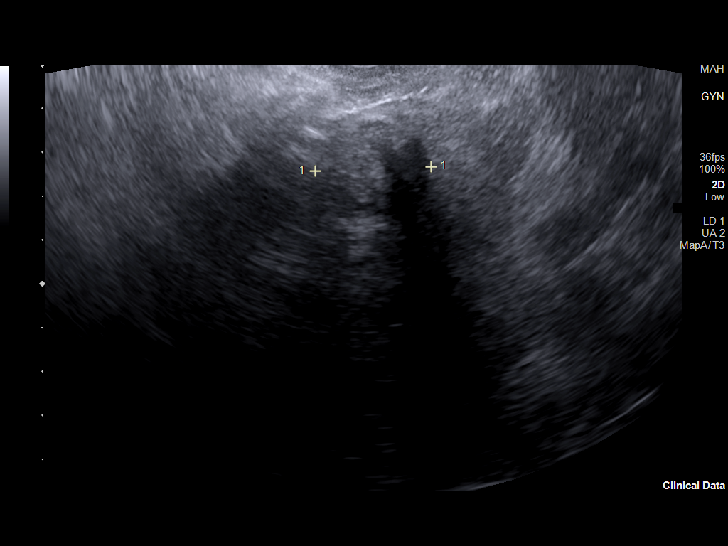
[im 11/33]
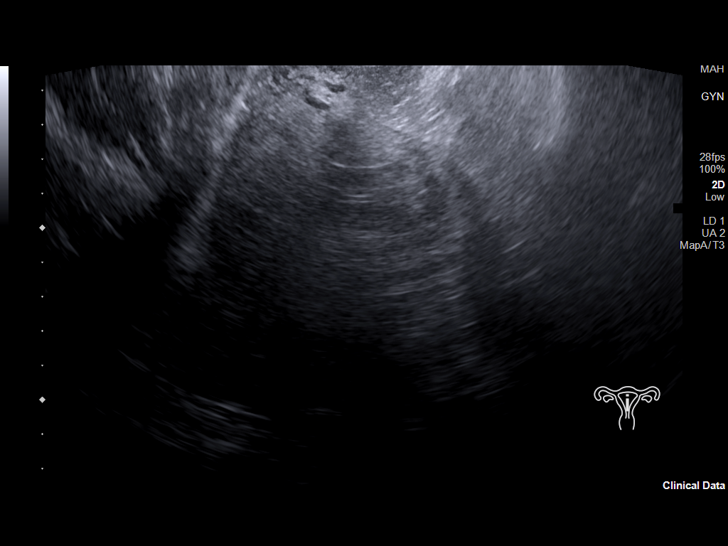
[im 13/33]
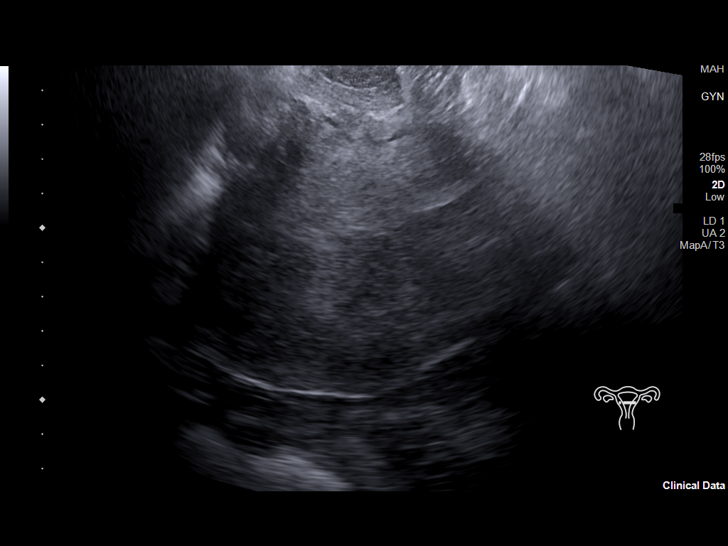
[im 15/33]
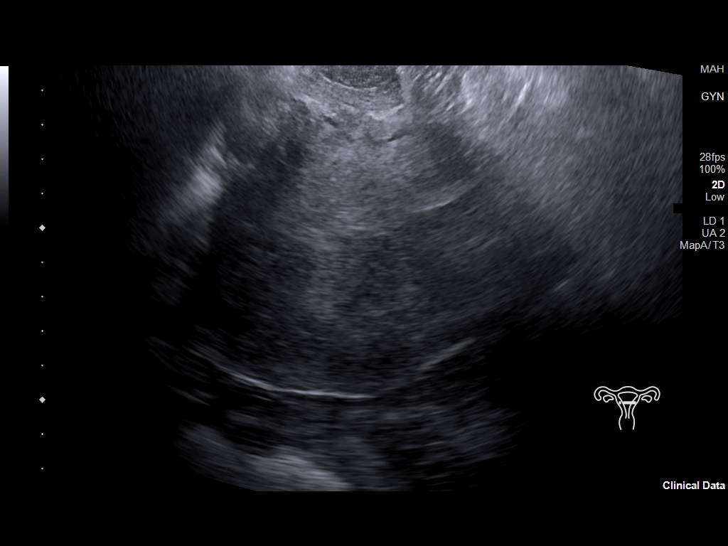
[im 18/33]
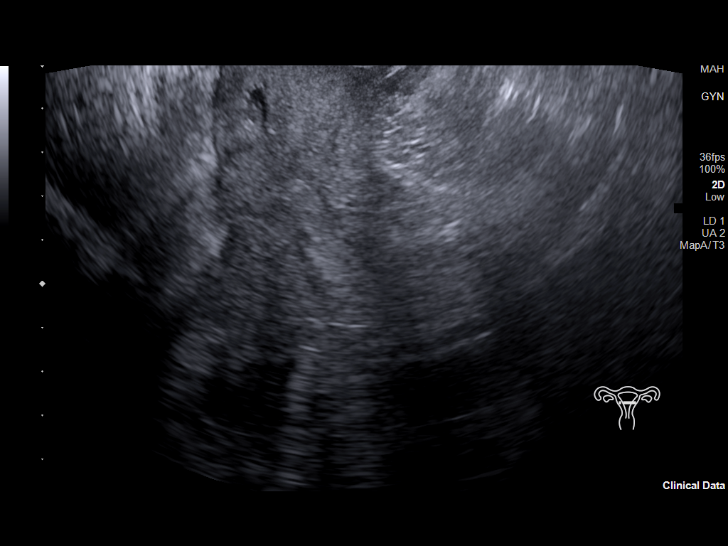
[im 21/33]
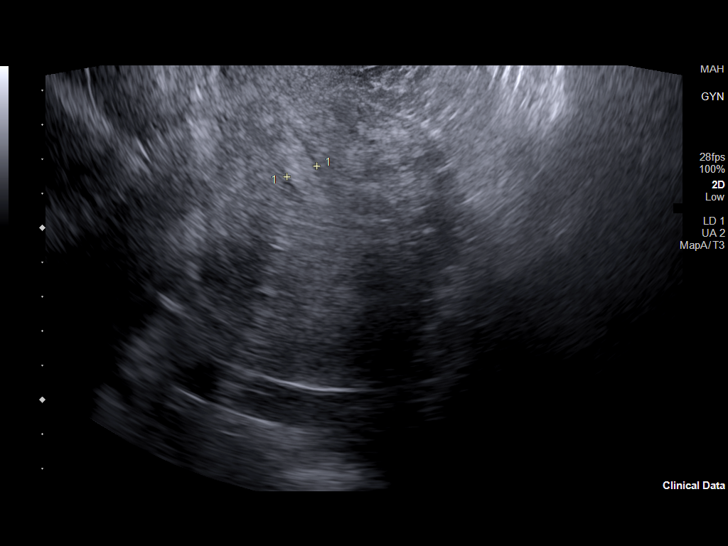
[im 22/33]
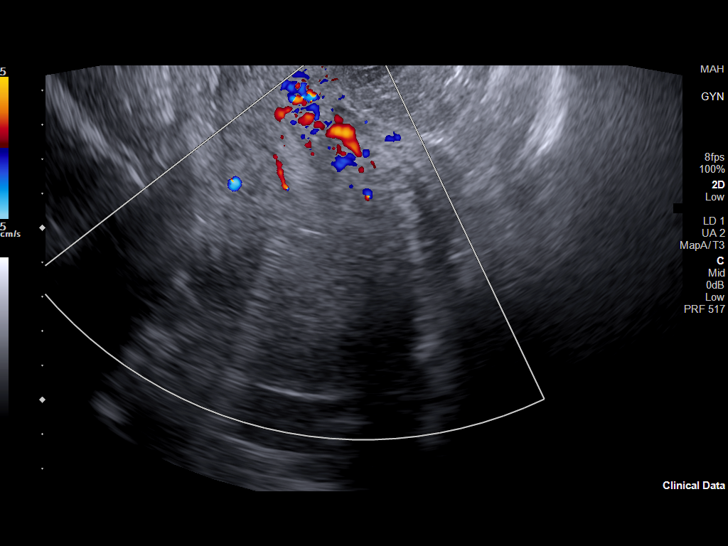
[im 25/33]
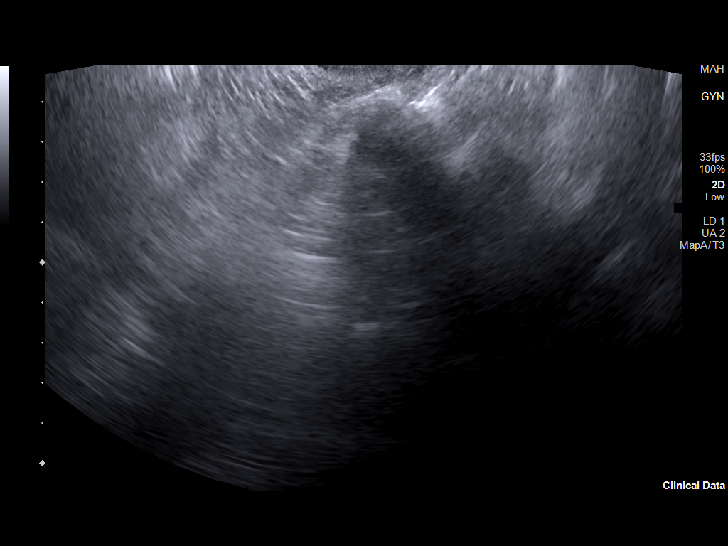
[im 27/33]
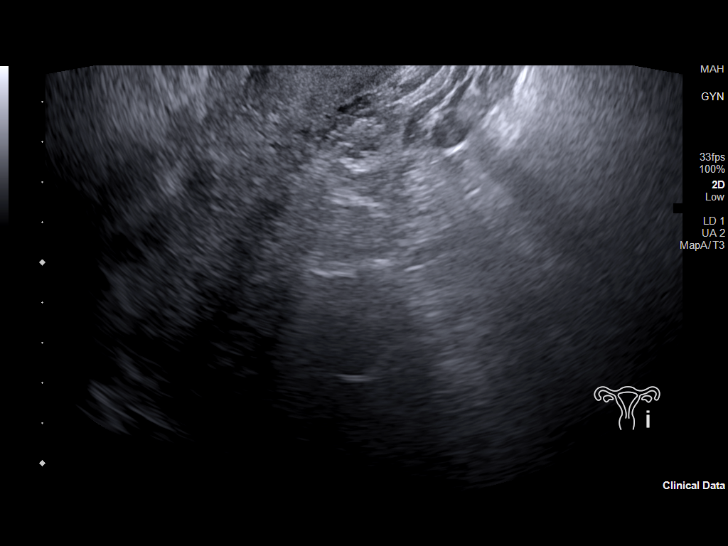
[im 30/33]
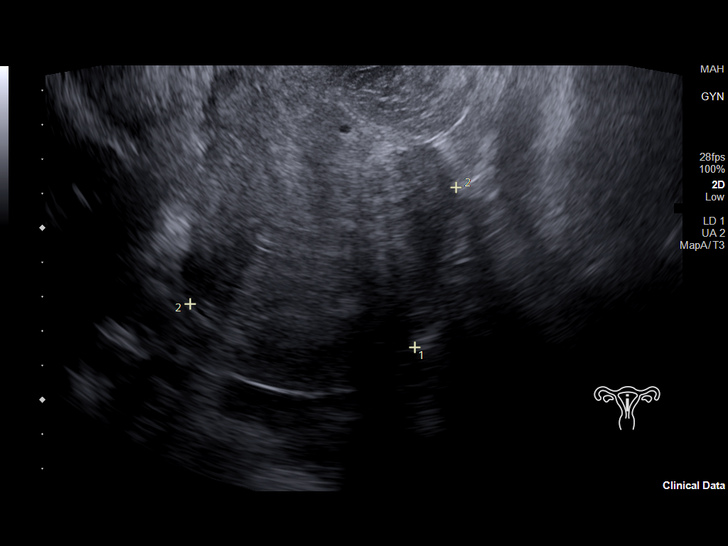
[im 33/33]
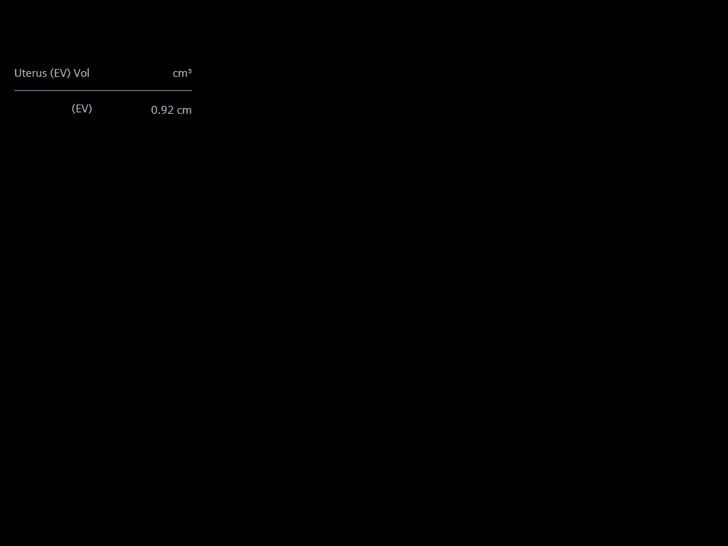

[14 of 25 positions shown; findings below may reference images not displayed]

FINDINGS: Uterus

Measurements: 10.5 x 8.4 x 8.9 cm = volume: 412.2 mL. There is
inhomogeneous echogenicity in myometrium. There is possible 3.2 x
2.4 x 2.6 cm fibroid in the right anterior lower uterine segment.
Uterus is retroverted.

Endometrium

Thickness: 9 mm. Evaluation was technically difficult due to
inhomogeneous echogenicity in myometrium and position of the uterus.
The endometrium is visualized only in the lower uterine segment.

Right ovary

Not sonographically visualized.

Left ovary

Not sonographically visualized.

Other findings:  No abnormal free fluid
IMPRESSION: Retroverted uterus. There is inhomogeneous echogenicity in
myometrium with possible 3.2 cm fibroid in the lower uterine
segment. Ovaries are not sonographically visualized.

Transvaginal pelvic sonogram is otherwise unremarkable.

## 2023-03-31 ENCOUNTER — Encounter: Payer: Self-pay | Admitting: *Deleted
